# Patient Record
Sex: Male | Born: 1975 | Hispanic: Yes | Marital: Married
Health system: Midwestern US, Academic
[De-identification: ages and names within clinical notes are randomized; demographics above are authoritative.]

---

## 2018-07-30 ENCOUNTER — Encounter: Admit: 2018-07-30 | Discharge: 2018-07-31

## 2018-07-30 ENCOUNTER — Encounter: Admit: 2018-07-30 | Discharge: 2018-07-30

## 2018-08-15 ENCOUNTER — Encounter: Admit: 2018-08-15 | Discharge: 2018-08-15

## 2018-08-15 ENCOUNTER — Emergency Department: Admit: 2018-08-15 | Discharge: 2018-08-15

## 2018-08-15 DIAGNOSIS — N201 Calculus of ureter: Secondary | ICD-10-CM

## 2018-08-15 MED ORDER — KETOROLAC 15 MG/ML IJ SOLN
15 mg | Freq: Once | INTRAVENOUS | 0 refills | Status: CP
Start: 2018-08-15 — End: ?
  Administered 2018-08-16: 02:00:00 15 mg via INTRAVENOUS

## 2018-08-15 MED ORDER — SODIUM CHLORIDE 0.9 % IJ SOLN
50 mL | Freq: Once | INTRAVENOUS | 0 refills | Status: CP
Start: 2018-08-15 — End: ?
  Administered 2018-08-16: 03:00:00 50 mL via INTRAVENOUS

## 2018-08-15 MED ORDER — SODIUM CHLORIDE 0.9 % IV SOLP
1000 mL | INTRAVENOUS | 0 refills | Status: CP
Start: 2018-08-15 — End: ?
  Administered 2018-08-16: 02:00:00 1000 mL via INTRAVENOUS

## 2018-08-15 MED ORDER — IOHEXOL 350 MG IODINE/ML IV SOLN
100 mL | Freq: Once | INTRAVENOUS | 0 refills | Status: CP
Start: 2018-08-15 — End: ?
  Administered 2018-08-16: 03:00:00 100 mL via INTRAVENOUS

## 2018-08-16 ENCOUNTER — Emergency Department: Admit: 2018-08-16 | Discharge: 2018-08-16 | Disposition: A | Discharge status: Home / Self Care

## 2018-08-16 DIAGNOSIS — E279 Disorder of adrenal gland, unspecified: Principal | ICD-10-CM

## 2018-08-16 DIAGNOSIS — N201 Calculus of ureter: ICD-10-CM

## 2018-08-16 LAB — COMPREHENSIVE METABOLIC PANEL
Lab: 139 MMOL/L (ref 137–147)
Lab: >60 mL/min (ref >60)
Lab: >60 mL/min (ref >60)

## 2018-08-16 LAB — URINALYSIS DIPSTICK
Lab: NEGATIVE 10*3/uL (ref 3–12)
Lab: NEGATIVE MMOL/L (ref 21–30)
Lab: NEGATIVE U/L — ABNORMAL HIGH (ref 7–56)
Lab: NEGATIVE g/dL (ref 3.5–5.0)

## 2018-08-16 LAB — LIPASE: Lab: 55 U/L (ref 11–82)

## 2018-08-16 LAB — URINALYSIS, MICROSCOPIC

## 2018-08-16 LAB — CBC AND DIFF
Lab: 0.1 10*3/uL (ref 0–0.20)
Lab: 0.1 10*3/uL (ref 0–0.45)
Lab: 7.6 10*3/uL (ref 4.5–11.0)

## 2018-08-16 LAB — POC CREATININE, RAD: Lab: 1 mg/dL (ref 0.4–1.24)

## 2018-08-16 NOTE — ED Provider Notes
Bobby Hoffman is a 43 y.o. male.    Chief Complaint:  Chief Complaint   Patient presents with   ??? Flank Pain     patient c/o left flank pain with radiation to left testicle       History of Present Illness:  Bobby Hoffman is a 43 y.o. male, with no significant past medical history, who presents to the emergency department for flank pain.  Patient reports intermittent left-sided flank pain since early yesterday morning, noting pain radiates into the left side of his abdomen and down into his testicles.  Patient states pain normally lasts ~2 hours - denies hx of similar symptoms.  He endorses chills, nausea, and one episode of emesis last night, but otherwise denies any fever, hematuria, constipation, diarrhea, testicular swelling, or new back pain.  Patient denies abdominal shx.  Denies EtOH/tobacco/illicit drug use.            History provided by:  Patient  Language interpreter used: Yes (spanish, norma)        Review of Systems:  Review of Systems   Constitutional: Positive for chills. Negative for fever.   HENT: Negative for congestion and rhinorrhea.    Eyes: Negative for visual disturbance.   Respiratory: Negative for cough and shortness of breath.    Cardiovascular: Negative for chest pain.   Gastrointestinal: Positive for abdominal pain, nausea and vomiting. Negative for constipation and diarrhea.   Genitourinary: Positive for flank pain (left) and testicular pain. Negative for dysuria, hematuria and scrotal swelling.   Musculoskeletal: Negative for back pain.   Skin: Negative for rash.   Neurological: Negative for syncope.       Allergies:  Patient has no known allergies.    Past Medical History:  History reviewed. No pertinent past medical history.    Past Surgical History:  History reviewed. No pertinent surgical history.    Pertinent medical/surgical history reviewed  History reviewed. No pertinent past medical history.  History reviewed. No pertinent surgical history.    Social History: Penis: Circumcised. No lesions.       Scrotum/Testes:         Right: Mass, tenderness or swelling not present. Right testis is descended. Cremasteric reflex is present.          Left: Tenderness (Mild tenderness to left testicle.) present. Mass or swelling not present. Left testis is descended. Cremasteric reflex is present.    Musculoskeletal:      Comments: Extremities normal on gross exam.   Skin:     General: Skin is warm and dry.   Neurological:      Mental Status: He is alert and oriented to person, place, and time.   Psychiatric:         Mood and Affect: Mood normal.         Behavior: Behavior normal.         Laboratory Results:  Labs Reviewed   CBC AND DIFF - Abnormal       Result Value Ref Range Status    White Blood Cells 7.6  4.5 - 11.0 K/UL Final    RBC 5.21  4.4 - 5.5 M/UL Final    Hemoglobin 15.4  13.5 - 16.5 GM/DL Final    Hematocrit 45.4  40 - 50 % Final    MCV 86.0  80 - 100 FL Final    MCH 29.6  26 - 34 PG Final    MCHC 34.5  32.0 - 36.0 G/DL Final    RDW  13.1  11 - 15 % Final    Platelet Count 218  150 - 400 K/UL Final    MPV 7.2  7 - 11 FL Final    Neutrophils 77  41 - 77 % Final    Lymphocytes 16 (*) 24 - 44 % Final    Monocytes 5  4 - 12 % Final    Eosinophils 1  0 - 5 % Final    Basophils 1  0 - 2 % Final    Absolute Neutrophil Count 5.90  1.8 - 7.0 K/UL Final    Absolute Lymph Count 1.20  1.0 - 4.8 K/UL Final    Absolute Monocyte Count 0.40  0 - 0.80 K/UL Final    Absolute Eosinophil Count 0.10  0 - 0.45 K/UL Final    Absolute Basophil Count 0.10  0 - 0.20 K/UL Final   COMPREHENSIVE METABOLIC PANEL - Abnormal    Sodium 139  137 - 147 MMOL/L Final    Potassium 4.2  3.5 - 5.1 MMOL/L Final    Chloride 106  98 - 110 MMOL/L Final    Glucose 120 (*) 70 - 100 MG/DL Final    Blood Urea Nitrogen 23  7 - 25 MG/DL Final    Creatinine 1.61  0.4 - 1.24 MG/DL Final    Calcium 9.5  8.5 - 10.6 MG/DL Final    Total Protein 7.1  6.0 - 8.0 G/DL Final    Total Bilirubin 0.3  0.3 - 1.2 MG/DL Final Albumin 4.6  3.5 - 5.0 G/DL Final    Alk Phosphatase 95  25 - 110 U/L Final    AST (SGOT) 41 (*) 7 - 40 U/L Final    CO2 22  21 - 30 MMOL/L Final    ALT (SGPT) 67 (*) 7 - 56 U/L Final    Anion Gap 11  3 - 12 Final    eGFR Non African American >60  >60 mL/min Final    eGFR African American >60  >60 mL/min Final   URINALYSIS DIPSTICK - Abnormal    Color,UA YELLOW   Final    Turbidity,UA CLEAR  CLEAR-CLEAR Final    Specific Gravity-Urine 1.034  1.003 - 1.035 Final    pH,UA 5.0  5.0 - 8.0 Final    Protein,UA 1+ (*) NEG-NEG Final    Glucose,UA NEG  NEG-NEG Final    Ketones,UA TRACE (*) NEG-NEG Final    Bilirubin,UA NEG  NEG-NEG Final    Blood,UA 3+ (*) NEG-NEG Final    Urobilinogen,UA INCREASED (*) NORM-NORMAL Final    Nitrite,UA NEG  NEG-NEG Final    Leukocytes,UA NEG  NEG-NEG Final    Urine Ascorbic Acid, UA NEG  NEG-NEG Final   LIPASE    Lipase 55  11 - 82 U/L Final   URINALYSIS, MICROSCOPIC    WBCs,UA 0-2  0 - 2 /HPF Final    RBCs,UA 10-20  0 - 3 /HPF Final    MucousUA 2+   Final    Squamous Epithelial Cells 0-2  0 - 5 Final    Calcium Oxalate Crystals MANY   Final   POC CREATININE, RAD    Creatinine, POC 1.0  0.4 - 1.24 MG/DL Final   POC CREATININE          Radiology Interpretation:    CT ABD/PELV W CONTRAST   Final Result         1.  Punctate calculus in the dependent urinary bladder, likely recently passed  from the left ureter. Minimal left hydroureter and left perinephric stranding are likely related to recent calculus which has passed into the bladder.   2.  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 Comparison with outside films, if available, be of greatest benefit to determine long-term stability. A nonemergent follow-up multiphase CT or MRI is recommended for further evaluation if not previously worked up.   3.  Mild hepatomegaly and diffuse hepatic steatosis.      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[facility])       Disposition/Follow up  ED Disposition     ED Disposition    Discharge        [facility]  [address]  [place] [address]  [phone]          [facility]  [address]  [place] [address]  [phone]        [facility] [place] [place] [facility]  [phone]  [place] [address]  [phone]      As needed      Medications:  Discharge Medication List as of 08/15/2018 10:46 PM          Procedure Notes:  Procedures      Attestation / Supervision:  IRockne Menghini, am scribing for and in the presence of Zenovia Jarred, APRN.      Rockne Menghini    Attestation / Supervision Note concerning Elnora Morrison: The service was provided by the APP alone with immediate availability of a physician in the ED. and I, Yvetta Coder, APRN-NP, personally performed the services described in this documentation as scribed in my presence and it is both accurate and complete.    Yvetta Coder, APRN-NP

## 2018-08-16 NOTE — ED Notes
Spanish interpreter paged at this time

## 2018-09-03 ENCOUNTER — Encounter: Admit: 2018-09-03 | Discharge: 2018-09-03

## 2019-12-05 ENCOUNTER — Encounter: Admit: 2019-12-05 | Discharge: 2019-12-05

## 2019-12-05 NOTE — Progress Notes
CT Abd/Pelv dated 08/15/2018 sent to patient's PCP on file on 09/11/2019.  Phone calls placed to PCP office regarding F/U recommendations placed on 10/15/2019 & 12/03/2019.
